# Patient Record
Sex: Female | Born: 1975 | Race: Black or African American | Hispanic: No | Marital: Single | Smoking: Current every day smoker
Health system: Southern US, Community
[De-identification: ages and names within clinical notes are randomized; demographics above are authoritative.]

---

## 2018-08-31 ENCOUNTER — Emergency Department (HOSPITAL_COMMUNITY): Payer: Medicaid - Out of State

## 2018-08-31 ENCOUNTER — Ambulatory Visit (HOSPITAL_COMMUNITY): Admission: RE | Admit: 2018-08-31 | Payer: Medicaid - Out of State | Source: Ambulatory Visit

## 2018-08-31 ENCOUNTER — Encounter (HOSPITAL_COMMUNITY): Payer: Self-pay | Admitting: Emergency Medicine

## 2018-08-31 ENCOUNTER — Other Ambulatory Visit: Payer: Self-pay

## 2018-08-31 ENCOUNTER — Emergency Department (HOSPITAL_COMMUNITY)
Admission: EM | Admit: 2018-08-31 | Discharge: 2018-08-31 | Disposition: A | Discharge status: Home / Self Care | Payer: Medicaid - Out of State | Attending: Emergency Medicine | Admitting: Emergency Medicine

## 2018-08-31 DIAGNOSIS — Z79899 Other long term (current) drug therapy: Secondary | ICD-10-CM | POA: Insufficient documentation

## 2018-08-31 DIAGNOSIS — Z7982 Long term (current) use of aspirin: Secondary | ICD-10-CM | POA: Diagnosis not present

## 2018-08-31 DIAGNOSIS — F17200 Nicotine dependence, unspecified, uncomplicated: Secondary | ICD-10-CM | POA: Insufficient documentation

## 2018-08-31 DIAGNOSIS — R102 Pelvic and perineal pain: Secondary | ICD-10-CM | POA: Insufficient documentation

## 2018-08-31 LAB — URINALYSIS, ROUTINE W REFLEX MICROSCOPIC
Bilirubin Urine: NEGATIVE
GLUCOSE, UA: NEGATIVE mg/dL
HGB URINE DIPSTICK: NEGATIVE
Ketones, ur: NEGATIVE mg/dL
Leukocytes, UA: NEGATIVE
NITRITE: POSITIVE — AB
PH: 6 (ref 5.0–8.0)
PROTEIN: NEGATIVE mg/dL

## 2018-08-31 LAB — CBC WITH DIFFERENTIAL/PLATELET
BASOS PCT: 0 %
Basophils Absolute: 0 10*3/uL (ref 0.0–0.1)
EOS ABS: 0.1 10*3/uL (ref 0.0–0.7)
EOS PCT: 1 %
HCT: 38.6 % (ref 36.0–46.0)
HEMOGLOBIN: 12.7 g/dL (ref 12.0–15.0)
Lymphocytes Relative: 35 %
Lymphs Abs: 2.4 10*3/uL (ref 0.7–4.0)
MCH: 28.6 pg (ref 26.0–34.0)
MCHC: 32.9 g/dL (ref 30.0–36.0)
MCV: 86.9 fL (ref 78.0–100.0)
MONOS PCT: 6 %
Monocytes Absolute: 0.4 10*3/uL (ref 0.1–1.0)
NEUTROS PCT: 58 %
Neutro Abs: 4 10*3/uL (ref 1.7–7.7)
PLATELETS: 331 10*3/uL (ref 150–400)
RBC: 4.44 MIL/uL (ref 3.87–5.11)
RDW: 13.4 % (ref 11.5–15.5)
WBC: 6.9 10*3/uL (ref 4.0–10.5)

## 2018-08-31 LAB — BASIC METABOLIC PANEL
Anion gap: 8 (ref 5–15)
BUN: 10 mg/dL (ref 6–20)
CO2: 24 mmol/L (ref 22–32)
CREATININE: 0.68 mg/dL (ref 0.44–1.00)
Calcium: 8.7 mg/dL — ABNORMAL LOW (ref 8.9–10.3)
Chloride: 106 mmol/L (ref 98–111)
Glucose, Bld: 124 mg/dL — ABNORMAL HIGH (ref 70–99)
Potassium: 3.3 mmol/L — ABNORMAL LOW (ref 3.5–5.1)
SODIUM: 138 mmol/L (ref 135–145)

## 2018-08-31 MED ORDER — NAPROXEN 500 MG PO TABS: 500.0000 mg | ORAL_TABLET | Freq: Two times a day (BID) | ORAL | 0 refills | Status: AC | PRN

## 2018-08-31 MED ORDER — SODIUM CHLORIDE 0.9 % IV BOLUS
500.0000 mL | Freq: Once | INTRAVENOUS | Status: AC
  Administered 2018-08-31: 500 mL via INTRAVENOUS

## 2018-08-31 MED ORDER — IOPAMIDOL (ISOVUE-300) INJECTION 61%
100.0000 mL | Freq: Once | INTRAVENOUS | Status: AC | PRN
  Administered 2018-08-31: 100 mL via INTRAVENOUS

## 2018-08-31 MED ORDER — NAPROXEN 250 MG PO TABS
500.0000 mg | ORAL_TABLET | Freq: Once | ORAL | Status: AC
  Administered 2018-08-31: 500 mg via ORAL
  Filled 2018-08-31: qty 2

## 2018-08-31 NOTE — Discharge Instructions (Signed)
Follow-up with gynecology for further evaluation of the fluid collection in your pelvis.

## 2018-08-31 NOTE — ED Triage Notes (Signed)
Pt c/o of right sided flank pain x 4 days. Denies urinary symptoms.

## 2018-08-31 NOTE — ED Provider Notes (Signed)
[facility] EMERGENCY DEPARTMENT Provider Note   CSN: [id number] Arrival date & time: 08/31/18  1228     History   Chief Complaint Chief Complaint  Patient presents with  . Flank Pain    HPI Cheryl Morales is a 42 y.o. female.  HPI Patient presents with right-sided flank pain.  Said for around 3 days.  Somewhat decreased appetite but may have a mild increase with eating.  Pain is dull.  Is worse with movements.  Has had previous hysterectomy but states she has one ovary still.  She does not know where she would be on her menses.  No fevers.  No diarrhea.  No urinary symptoms.  No constipation.  Has really not had pains like this before.  Pain is in the right lower abdomen/right flank.  Is History reviewed. No pertinent past medical history.  There are no active problems to display for this patient.   History reviewed. No pertinent surgical history.   OB History   None      Home Medications    Prior to Admission medications   Medication Sig Start Date End Date Taking? Authorizing Provider  amitriptyline (ELAVIL) 50 MG tablet Take 50 mg by mouth at bedtime. 07/20/18  Yes [provider]  aspirin EC 81 MG tablet Take 81 mg by mouth daily.   Yes [provider]  diclofenac (VOLTAREN) 75 MG EC tablet Take 75 mg by mouth 2 (two) times daily as needed for mild pain or moderate pain.  06/21/18  Yes [provider]  gabapentin (NEURONTIN) 400 MG capsule Take 400 mg by mouth at bedtime. *Prescribed up to 4 times daily 05/24/18  Yes [provider]  hydrochlorothiazide (HYDRODIURIL) 25 MG tablet Take 25 mg by mouth daily. 06/22/18  Yes [provider]  lidocaine (LIDODERM) 5 % Place 1 patch onto the skin daily as needed (for pain).  06/27/18  Yes [provider]  tiZANidine (ZANAFLEX) 4 MG tablet Take 2-4 mg by mouth at bedtime.  08/16/18  Yes [provider]  naproxen (NAPROSYN) 500 MG tablet Take 1 tablet (500 mg total) by mouth 2  (two) times daily as needed. 08/31/18   Benjiman Core, MD    Family History History reviewed. No pertinent family history.  Social History Social History   Tobacco Use  . Smoking status: Current Every Day Smoker  . Smokeless tobacco: Never Used  Substance Use Topics  . Alcohol use: Never    Frequency: Never  . Drug use: Never     Allergies   Bee venom and Morphine and related   Review of Systems Review of Systems  Constitutional: Negative for appetite change.  HENT: Negative for congestion.   Respiratory: Negative for shortness of breath.   Gastrointestinal: Positive for abdominal pain.  Genitourinary: Positive for flank pain.  Musculoskeletal: Positive for back pain.  Skin: Negative for rash.  Neurological: Negative for weakness.  Hematological: Negative for adenopathy.  Psychiatric/Behavioral: Negative for confusion.     Physical Exam Updated Vital Signs BP 115/74 (BP Location: Left Arm)   Pulse 80   Temp 98.2 F (36.8 C) (Oral)   Resp 14   Ht 5\' 3"  (1.6 m)   Wt 115.7 kg   SpO2 100%   BMI 45.17 kg/m   Physical Exam  Constitutional: She appears well-developed.  HENT:  Head: Atraumatic.  Eyes: EOM are normal.  Neck: Neck supple.  Cardiovascular: Normal rate.  Pulmonary/Chest: Effort normal.  Abdominal: There is tenderness.  RLQ  tenderness without rebound or guarding.   Musculoskeletal: She exhibits no tenderness.  Neurological: She is alert. Coordination normal.  Skin: Skin is warm. Capillary refill takes less than 2 seconds.     ED Treatments / Results  Labs (all labs ordered are listed, but only abnormal results are displayed) Labs Reviewed  BASIC METABOLIC PANEL - Abnormal; Notable for the following components:      Result Value   Potassium 3.3 (*)    Glucose, Bld 124 (*)    Calcium 8.7 (*)    All other components within normal limits  URINALYSIS, ROUTINE W REFLEX MICROSCOPIC - Abnormal; Notable for the following components:    APPearance HAZY (*)    Specific Gravity, Urine >1.046 (*)    Nitrite POSITIVE (*)    Bacteria, UA RARE (*)    All other components within normal limits  CBC WITH DIFFERENTIAL/PLATELET    EKG None  Radiology US Pelvis Complete  Result Date: 08/31/2018 CLINICAL DATA:  Right lower quadrant pain for 4 days. Prior hysterectomy. Cystic right adnexal lesion on CT. EXAM: TRANSABDOMINAL ULTRASOUND OF PELVIS TECHNIQUE: Transabdominal ultrasound examination of the pelvis was performed including evaluation of the uterus, ovaries, adnexal regions, and pelvic cul-de-sac. COMPARISON:  CT abdomen and pelvis 08/31/2018 FINDINGS: The patient declined the transvaginal portion of the examination. Uterus Surgically absent. Right ovary A cystic lesion or fluid collection in the right adnexa measures 14 x 4 cm, partly tubular in configuration without internal septations. The right ovary was not identified. Left ovary Not identified. Other findings:  No abnormal free fluid. IMPRESSION: 14 cm cystic abnormality in the right adnexa. Considerations include a loculated fluid collection, hydrosalpinx, and peritoneal inclusion cyst. No inflammatory changes were present on the earlier CT to suggest acute infection/abscess. The ovaries were not identified. Pelvic MRI may be helpful for further characterization on a nonemergent basis. Electronically Signed   By: Sebastian Ache M.D.   On: 08/31/2018 17:18   Ct Abdomen Pelvis W Contrast  Result Date: 08/31/2018 CLINICAL DATA:  Right lower quadrant pain for 4 days. Clinical concern for appendicitis. EXAM: CT ABDOMEN AND PELVIS WITH CONTRAST TECHNIQUE: Multidetector CT imaging of the abdomen and pelvis was performed using the standard protocol following bolus administration of intravenous contrast. CONTRAST:  ISOVUE-300 IOPAMIDOL (ISOVUE-300) INJECTION 61% COMPARISON:  None. FINDINGS: Lower chest: Unremarkable. Hepatobiliary: Focal hypo attenuation in the left liver along the  falciform ligament likely related to focal fatty deposition. This is on a background of diffuse hepatic steatosis. There is no evidence for gallstones, gallbladder wall thickening, or pericholecystic fluid. No intrahepatic or extrahepatic biliary dilation. Pancreas: No focal mass lesion. No dilatation of the main duct. No intraparenchymal cyst. No peripancreatic edema. Spleen: No splenomegaly. No focal mass lesion. Adrenals/Urinary Tract: No adrenal nodule or mass. Kidneys unremarkable. No evidence for hydroureter. The urinary bladder appears normal for the degree of distention. Stomach/Bowel: Stomach is nondistended. No gastric wall thickening. No evidence of outlet obstruction. Duodenum is normally positioned as is the ligament of Treitz. No small bowel wall thickening. No small bowel dilatation. Small bowel anastomosis noted midline abdomen without evidence for stricture/obstruction. The terminal ileum is normal. The appendix is not visualized, but there is no edema or inflammation in the region of the cecum. No gross colonic mass. No colonic wall thickening. No substantial diverticular change. Vascular/Lymphatic: No abdominal aortic aneurysm. No abdominal aortic atherosclerotic calcification. There is no gastrohepatic or hepatoduodenal ligament lymphadenopathy. No intraperitoneal or retroperitoneal lymphadenopathy. Persistent left-sided IVC evident. No  pelvic sidewall lymphadenopathy. Reproductive: Uterus surgically absent. 4.8 x 6.6 x 7.5 cm cystic lesion is identified in the right adnexal space. The right gonadal vein is seen to track down along the lateral margin of this cyst into soft tissue density. This cystic lesion is contiguous with fluid density in the cul-de-sac which appears loculated. Other: Fluid density noted in the cul-de-sac. Musculoskeletal: No worrisome lytic or sclerotic osseous abnormality. IMPRESSION: 1. 5 x 7 x 8 cm cystic lesion right adnexal space potentially extending into the  cul-de-sac. Pelvic ultrasound recommended to further evaluate. 2. Hepatic steatosis. Electronically Signed   By: Kennith CenterEric  Mansell M.D.   On: 08/31/2018 15:30    Procedures Procedures (including critical care time)  Medications Ordered in ED Medications  sodium chloride 0.9 % bolus 500 mL (0 mLs Intravenous Stopped 08/31/18 1534)  iopamidol (ISOVUE-300) 61 % injection 100 mL (100 mLs Intravenous Contrast Given 08/31/18 1501)  naproxen (NAPROSYN) tablet 500 mg (500 mg Oral Given 08/31/18 1755)     Initial Impression / Assessment and Plan / ED Course  I have reviewed the triage vital signs and the nursing notes.  Pertinent labs & imaging results that were available during my care of the patient were reviewed by me and considered in my medical decision making (see chart for details).     Patient with flank pain.  Right lower quadrant.  Lab work reassuring.  Has intra-abdominal/pelvic fluid collection.  CT scan and ultrasound did not show clear cause.  Has had previous hysterectomy but states she still has one ovary.  Will have follow-up as an outpatient for further management.  Final Clinical Impressions(s) / ED Diagnoses   Final diagnoses:  Pelvic pain    ED Discharge Orders         Ordered    naproxen (NAPROSYN) 500 MG tablet  2 times daily PRN     08/31/18 1749           Benjiman CorePickering, Soleil Mas, MD 08/31/18 2032

## 2018-08-31 NOTE — ED Notes (Signed)
Pt to CT

## 2018-08-31 NOTE — ED Notes (Signed)
Patient transported to Ultrasound 

## 2019-04-28 IMAGING — US US PELVIS COMPLETE
1 series · 14 of 25 positions shown · non-contrast
Comparison: CT abdomen and pelvis 08/31/2018

CLINICAL DATA: Right lower quadrant pain for 4 days. Prior
hysterectomy. Cystic right adnexal lesion on CT.

EXAM:
TRANSABDOMINAL ULTRASOUND OF PELVIS
TECHNIQUE: Transabdominal ultrasound examination of the pelvis was performed
including evaluation of the uterus, ovaries, adnexal regions, and
pelvic cul-de-sac.

[Series 1: us pelvis complete · 0.23mm/px · 42 acquisitions, 14 frames shown]
[im 1/42]
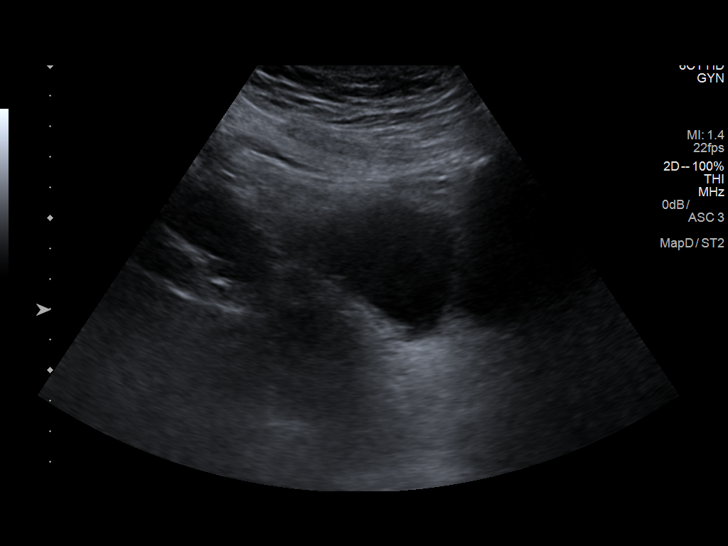
[im 4/42]
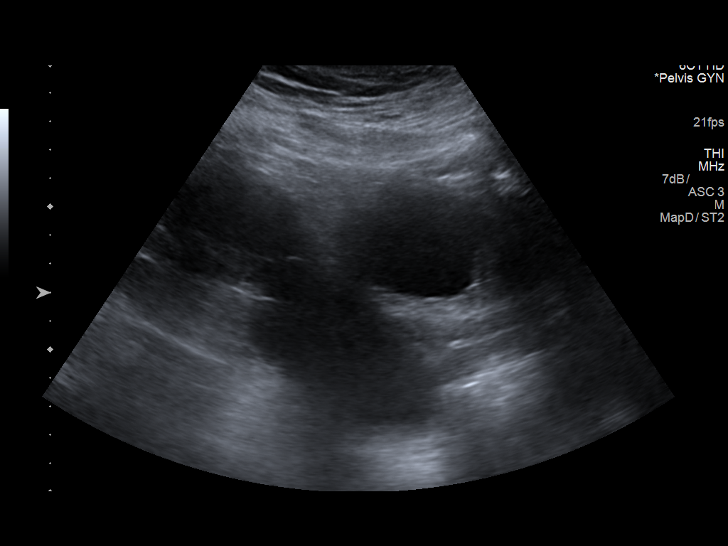
[im 7/42]
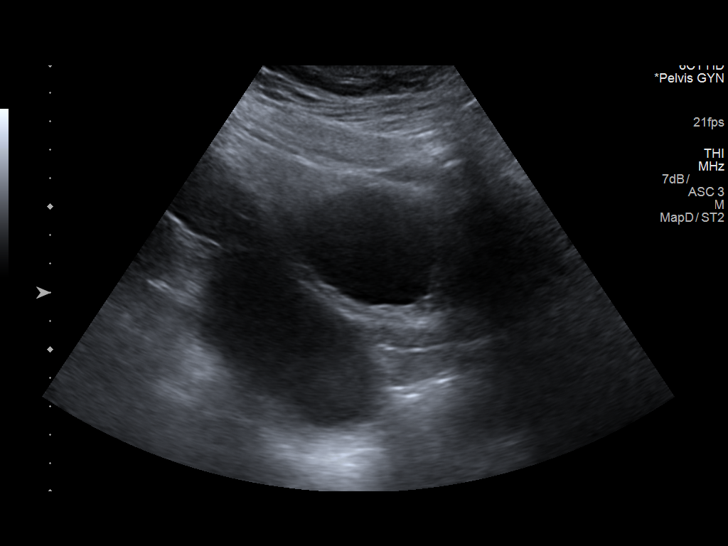
[im 11/42]
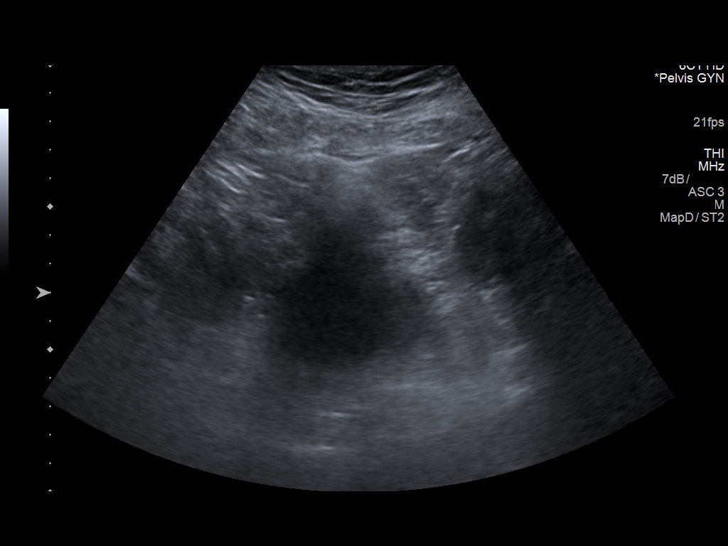
[im 14/42]
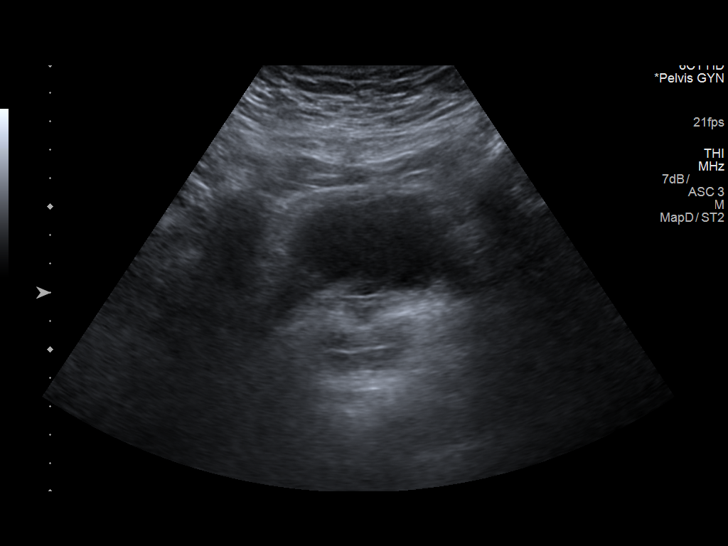
[im 16/42]
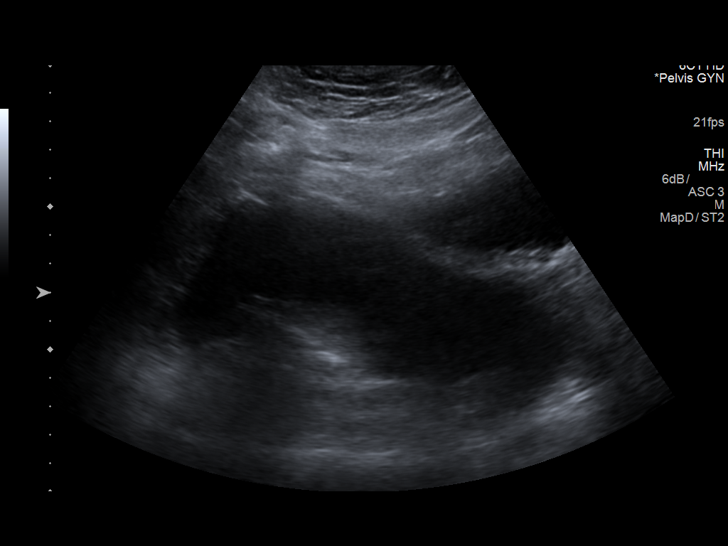
[im 19/42]
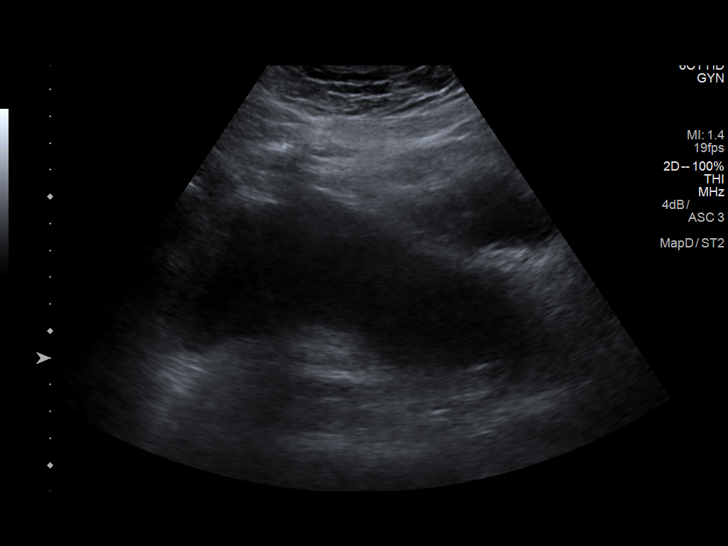
[im 23/42]
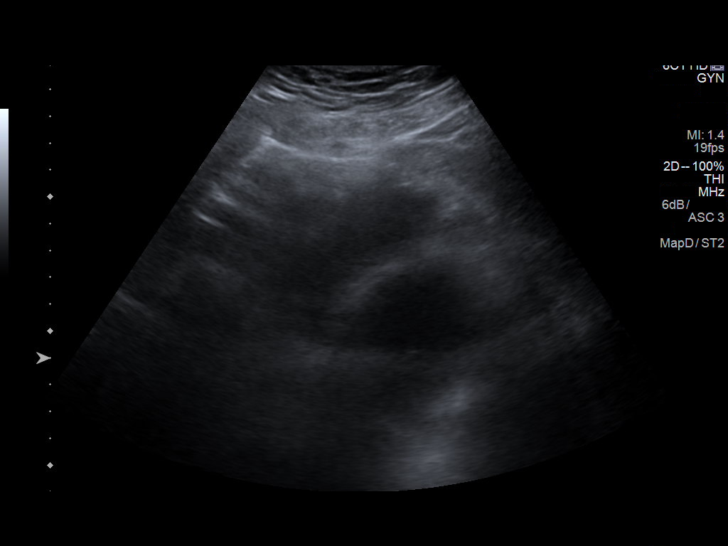
[im 26/42]
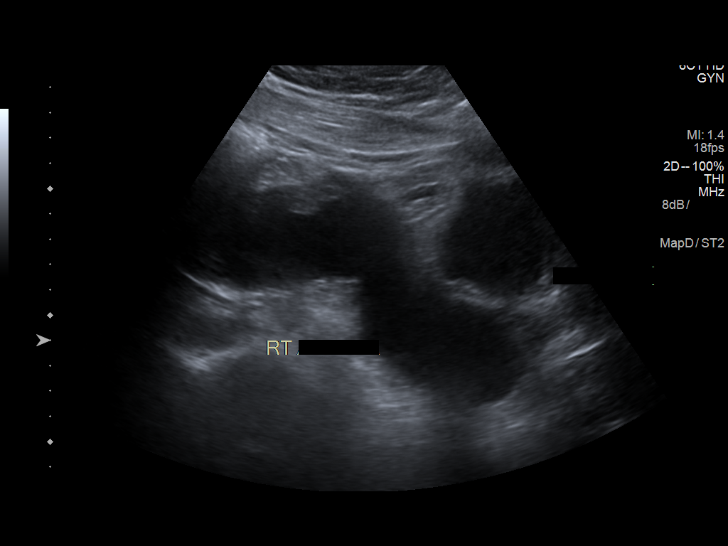
[im 28/42]
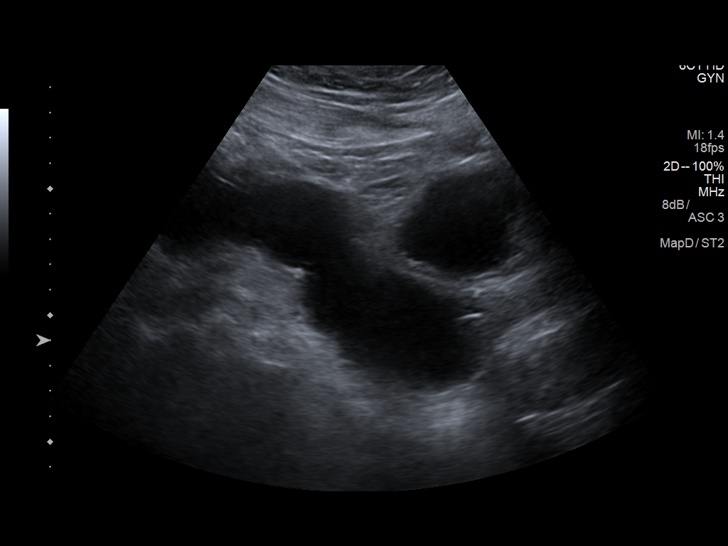
[im 31/42]
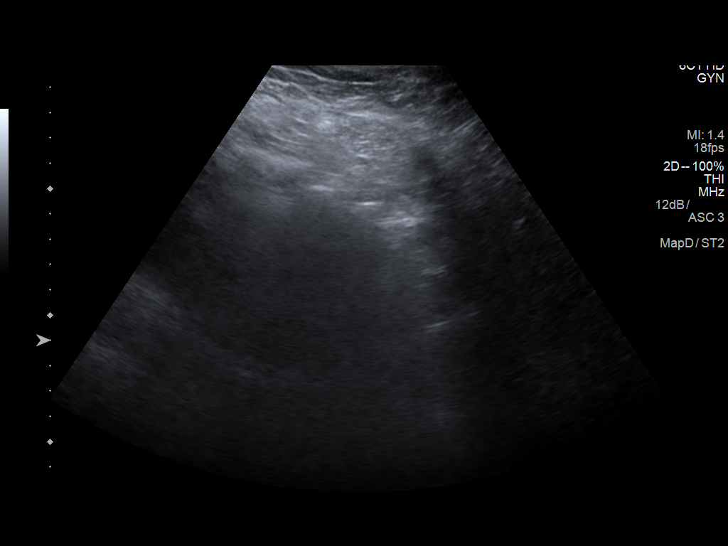
[im 35/42]
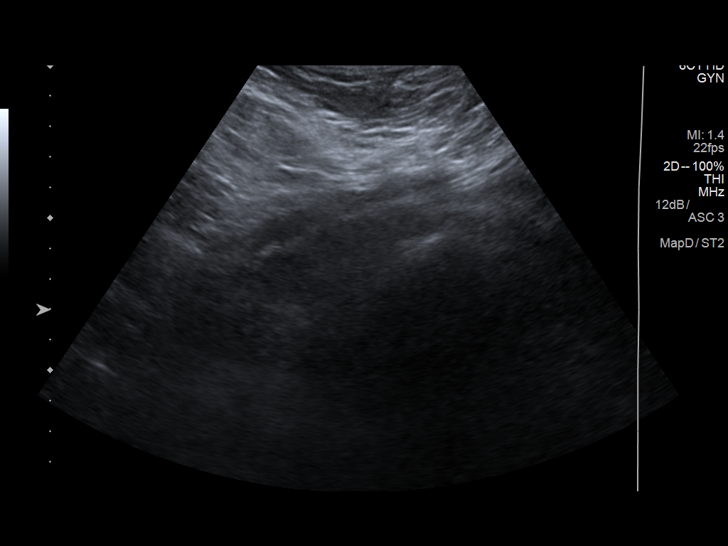
[im 38/42]
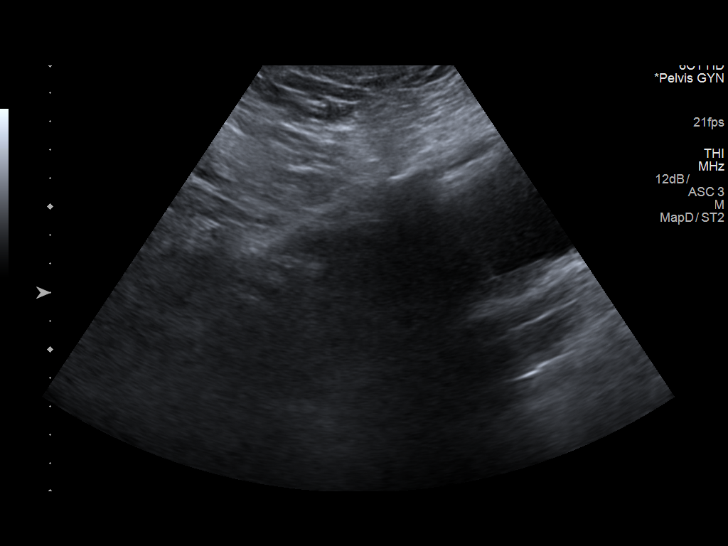
[im 42/42]
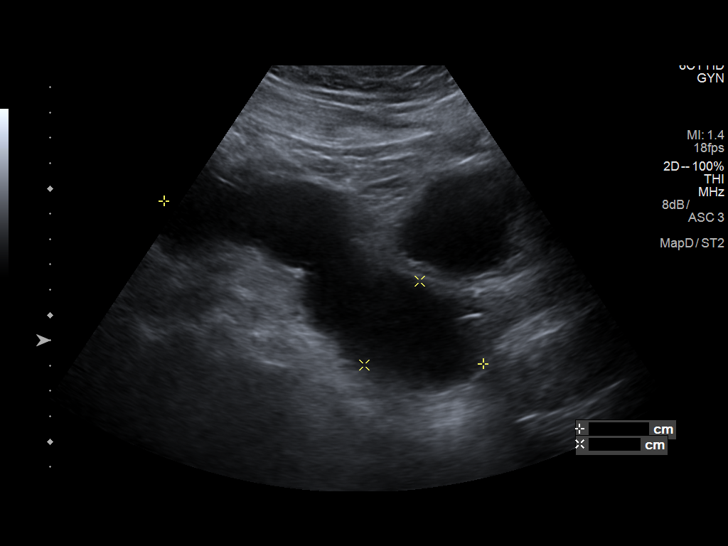

[14 of 25 positions shown; findings below may reference images not displayed]

FINDINGS: The patient declined the transvaginal portion of the examination.

Uterus

Surgically absent.

Right ovary

A cystic lesion or fluid collection in the right adnexa measures 14
x 4 cm, partly tubular in configuration without internal septations.
The right ovary was not identified.

Left ovary

Not identified.

Other findings:  No abnormal free fluid.
IMPRESSION: 14 cm cystic abnormality in the right adnexa. Considerations include
a loculated fluid collection, hydrosalpinx, and peritoneal inclusion
cyst. No inflammatory changes were present on the earlier CT to
suggest acute infection/abscess. The ovaries were not identified.
Pelvic MRI may be helpful for further characterization on a
nonemergent basis.
